# Patient Record
Sex: Male | Born: 2000 | Race: White | Hispanic: No | Marital: Single | Smoking: Current some day smoker
Health system: Southern US, Community
[De-identification: ages and names within clinical notes are randomized; demographics above are authoritative.]

---
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
## 2020-06-29 IMAGING — CT CT HEAD W/O CM
3 series · 16 of 47 positions shown, 19 images · non-contrast
Comparison: None.

CLINICAL DATA: Headache.

EXAM:
CT HEAD WITHOUT CONTRAST
TECHNIQUE: Contiguous axial images were obtained from the base of the skull
through the vertex without intravenous contrast.

[Series 2: head wo · axial · 0.41mm/px · z∈[-168,-38]mm · 10 of 32 slices shown, 13 images]
[im 3/32  brain]
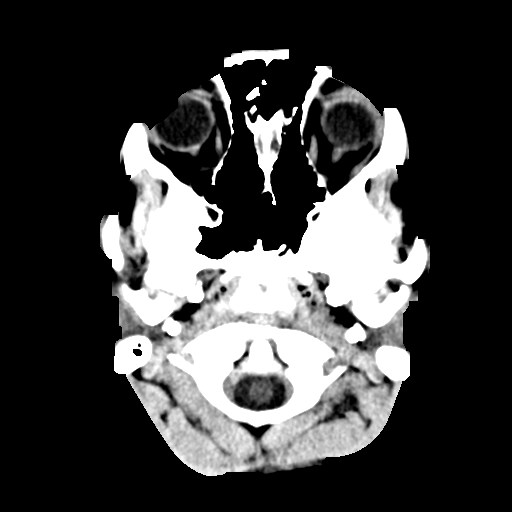
[im 3/32  bone]
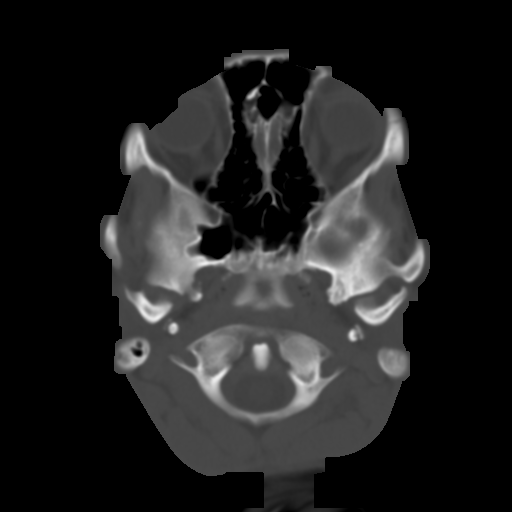
[im 6/32  brain]
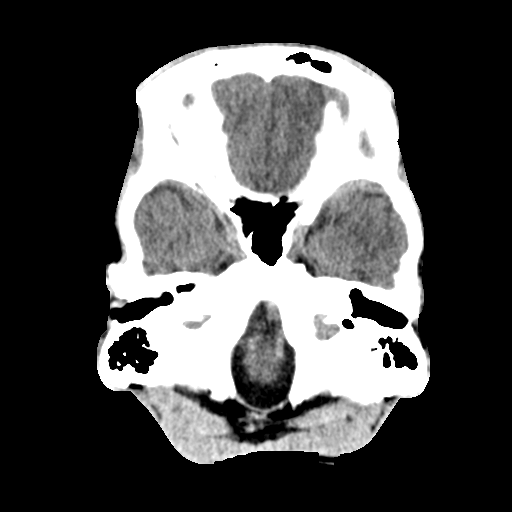
[im 9/32  brain]
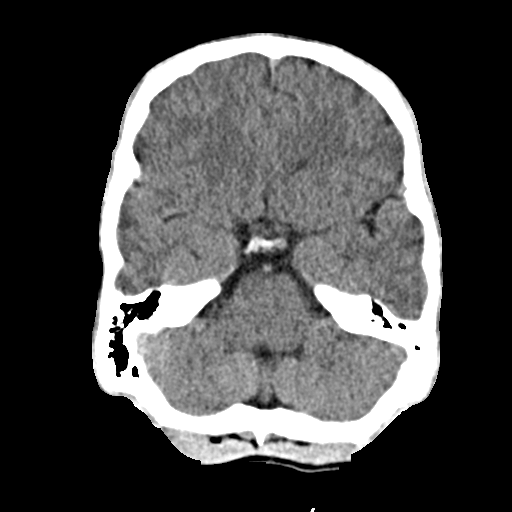
[im 11/32  brain]
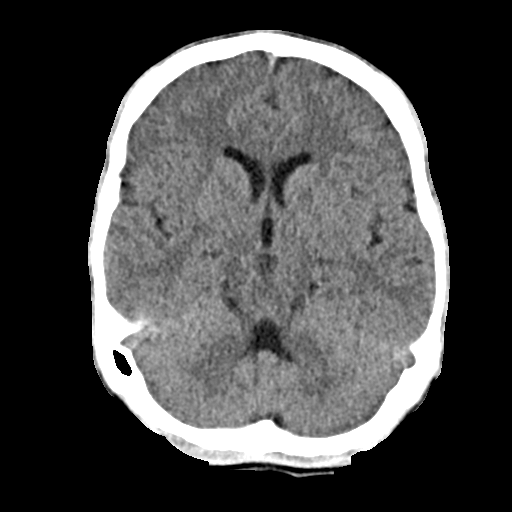
[im 14/32  brain]
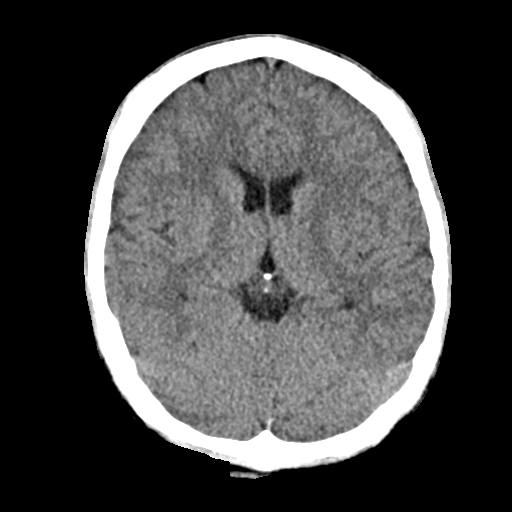
[im 14/32  bone]
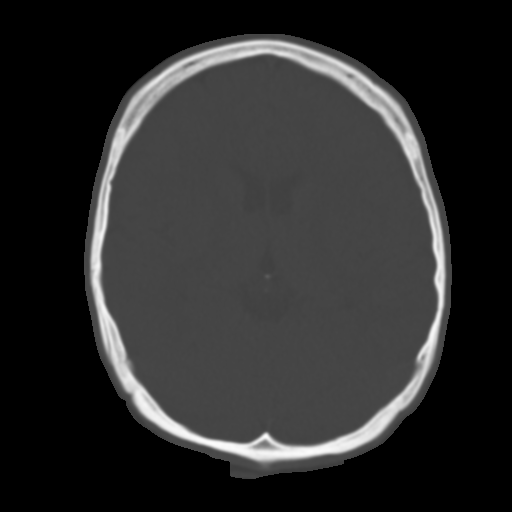
[im 18/32  brain]
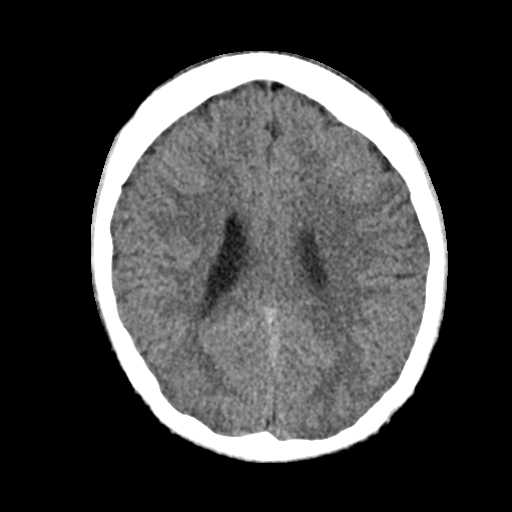
[im 21/32  brain]
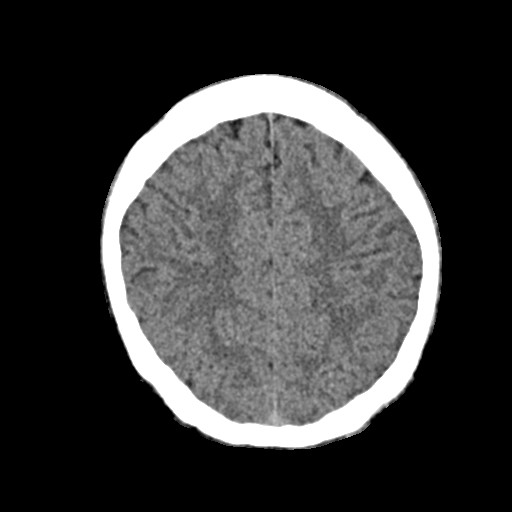
[im 24/32  brain]
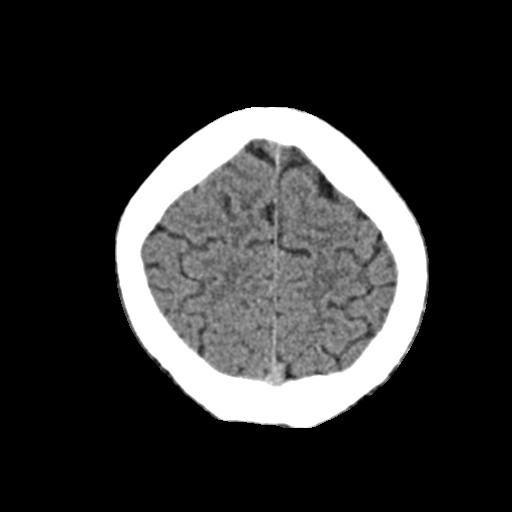
[im 26/32  brain]
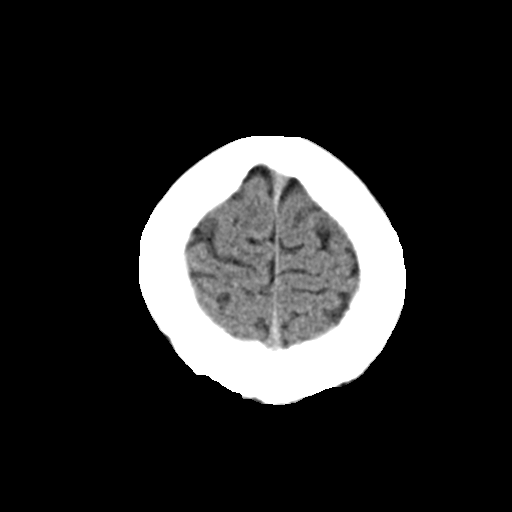
[im 26/32  bone]
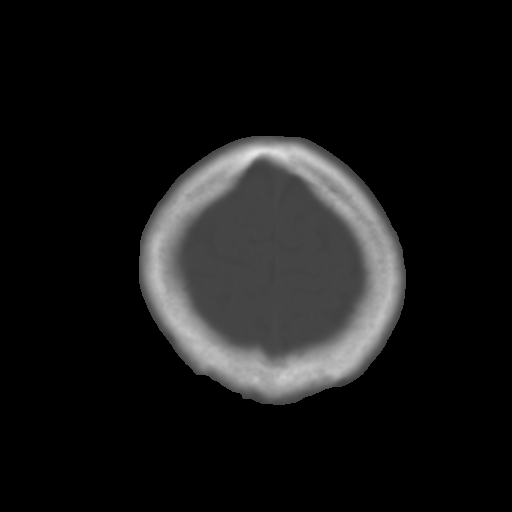
[im 29/32  brain]
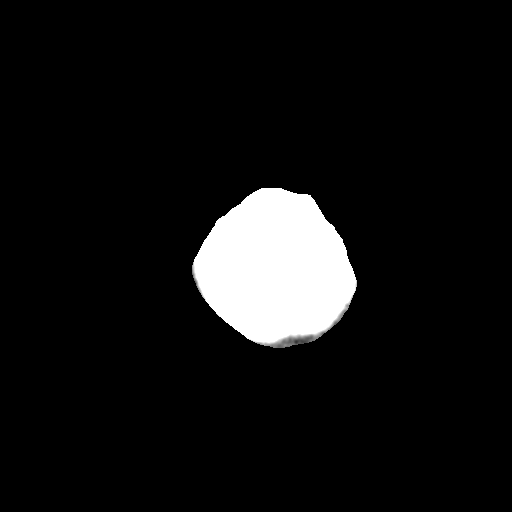

[Series 4: coronal soft · coronal · 0.31mm/px · 3 of 65 slices shown]
[im 22/65  brain]
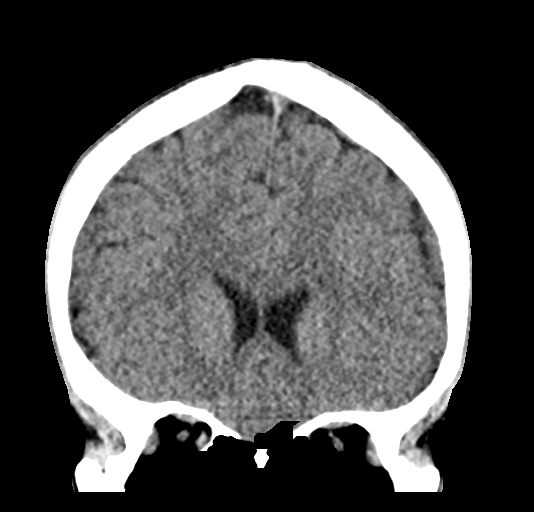
[im 29/65  brain]
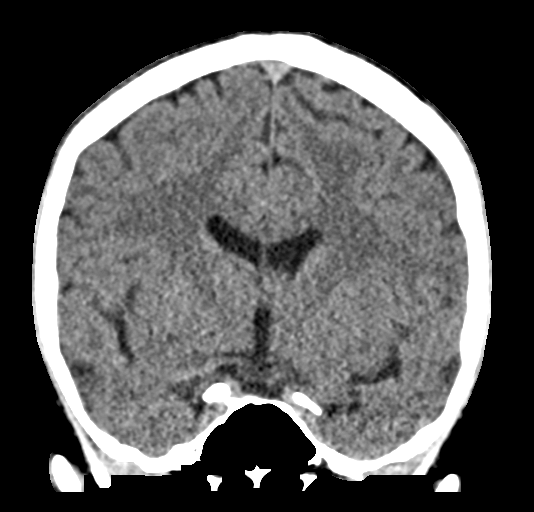
[im 36/65  brain]
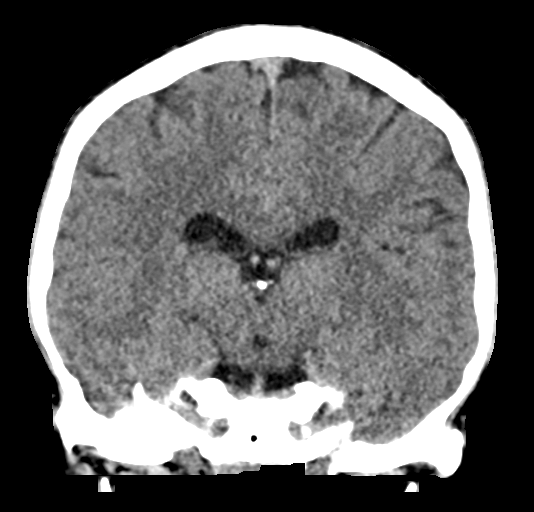

[Series 5: sag soft · sagittal · 0.32mm/px · 3 of 54 slices shown]
[im 18/54  brain]
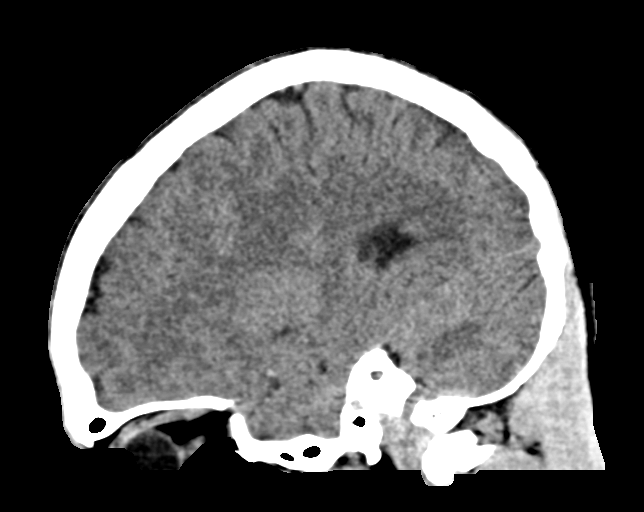
[im 27/54  brain]
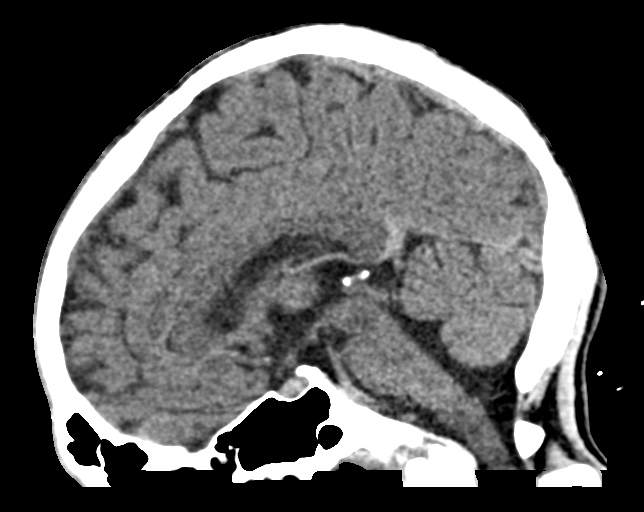
[im 36/54  brain]
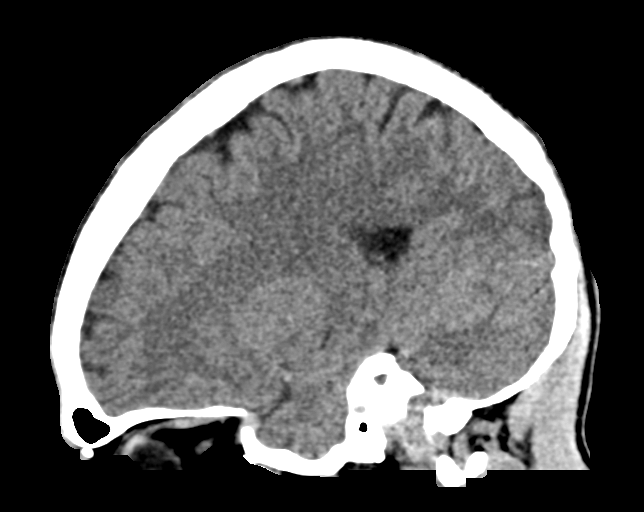

[16 of 47 positions shown; findings below may reference images not displayed]

FINDINGS: Brain: No evidence of acute infarction, hemorrhage, hydrocephalus,
extra-axial collection or mass lesion/mass effect.

Vascular: No hyperdense vessel or unexpected calcification.

Skull: Normal. Negative for fracture or focal lesion.

Sinuses/Orbits: No acute finding.

Other: None.
IMPRESSION: Normal head CT.

## 2021-01-27 DEATH — deceased
